# Patient Record
Sex: Female | Born: 2000
Health system: Southern US, Community
[De-identification: ages and names within clinical notes are randomized; demographics above are authoritative.]

## PROBLEM LIST (undated history)

## (undated) DIAGNOSIS — S060XAA Concussion with loss of consciousness status unknown, initial encounter: Secondary | ICD-10-CM

## (undated) DIAGNOSIS — R29898 Other symptoms and signs involving the musculoskeletal system: Secondary | ICD-10-CM

## (undated) DIAGNOSIS — S62109A Fracture of unspecified carpal bone, unspecified wrist, initial encounter for closed fracture: Secondary | ICD-10-CM

## (undated) DIAGNOSIS — L309 Dermatitis, unspecified: Secondary | ICD-10-CM

## (undated) DIAGNOSIS — T7840XA Allergy, unspecified, initial encounter: Secondary | ICD-10-CM

## (undated) DIAGNOSIS — G43909 Migraine, unspecified, not intractable, without status migrainosus: Secondary | ICD-10-CM

## (undated) DIAGNOSIS — S060X9A Concussion with loss of consciousness of unspecified duration, initial encounter: Secondary | ICD-10-CM

## (undated) HISTORY — DX: Dermatitis, unspecified: L30.9

## (undated) HISTORY — DX: Allergy, unspecified, initial encounter: T78.40XA

## (undated) HISTORY — DX: Other symptoms and signs involving the musculoskeletal system: R29.898

## (undated) HISTORY — PX: NO PAST SURGERIES: SHX2092

## (undated) HISTORY — DX: Concussion with loss of consciousness of unspecified duration, initial encounter: S06.0X9A

## (undated) HISTORY — DX: Concussion with loss of consciousness status unknown, initial encounter: S06.0XAA

## (undated) HISTORY — DX: Migraine, unspecified, not intractable, without status migrainosus: G43.909

## (undated) HISTORY — DX: Fracture of unspecified carpal bone, unspecified wrist, initial encounter for closed fracture: S62.109A

---
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
## 2021-01-08 DIAGNOSIS — R221 Localized swelling, mass and lump, neck: Secondary | ICD-10-CM | POA: Diagnosis not present

## 2021-01-08 DIAGNOSIS — T781XXA Other adverse food reactions, not elsewhere classified, initial encounter: Secondary | ICD-10-CM | POA: Diagnosis not present

## 2021-01-08 DIAGNOSIS — R110 Nausea: Secondary | ICD-10-CM | POA: Diagnosis not present

## 2021-01-08 DIAGNOSIS — R0600 Dyspnea, unspecified: Secondary | ICD-10-CM | POA: Diagnosis not present

## 2021-01-08 DIAGNOSIS — T7805XA Anaphylactic reaction due to tree nuts and seeds, initial encounter: Secondary | ICD-10-CM | POA: Diagnosis not present

## 2021-01-08 DIAGNOSIS — X58XXXA Exposure to other specified factors, initial encounter: Secondary | ICD-10-CM | POA: Diagnosis not present

## 2021-01-08 DIAGNOSIS — R220 Localized swelling, mass and lump, head: Secondary | ICD-10-CM | POA: Diagnosis not present

## 2021-01-21 ENCOUNTER — Telehealth: Payer: Self-pay | Admitting: *Deleted

## 2021-01-21 NOTE — Telephone Encounter (Signed)
Faxed completed/signed PA Aimovig to [facility]. Received fax confirmation, waiting on determination.

## 2021-01-21 NOTE — Telephone Encounter (Signed)
Received fax from [facility] that PA approved 12/22/20-01/21/22. Case ID: [id number]

## 2021-05-28 DIAGNOSIS — Z20822 Contact with and (suspected) exposure to covid-19: Secondary | ICD-10-CM | POA: Diagnosis not present

## 2021-05-28 DIAGNOSIS — U071 COVID-19: Secondary | ICD-10-CM | POA: Diagnosis not present

## 2021-06-17 ENCOUNTER — Other Ambulatory Visit: Payer: Self-pay

## 2021-06-17 ENCOUNTER — Encounter: Payer: Self-pay | Admitting: Family Medicine

## 2021-06-17 ENCOUNTER — Ambulatory Visit (INDEPENDENT_AMBULATORY_CARE_PROVIDER_SITE_OTHER): Payer: BC Managed Care – PPO | Admitting: Family Medicine

## 2021-06-17 VITALS — BP 110/62 | HR 84 | Temp 98.1°F | Ht 68.06 in | Wt 145.6 lb

## 2021-06-17 DIAGNOSIS — Z0000 Encounter for general adult medical examination without abnormal findings: Secondary | ICD-10-CM | POA: Diagnosis not present

## 2021-06-17 NOTE — Patient Instructions (Signed)
Confirm if you have had prior HPV vaccine.

## 2021-06-17 NOTE — Progress Notes (Signed)
Established Patient Office Visit  Subjective:  Patient ID: Nichole [name], female    DOB: 31-Jan-2001  Age: 20 y.o. MRN: [id number]  CC:  Chief Complaint  Patient presents with   Annual Exam    No new concerns     HPI Nichole [name] presents for annual physical exam/well visit.  She is attending [employer] and will be a rising sophomore.  First year went well.  She is planning to study public health and has a goal of being a Librarian, academic.  She states that she was seen through GYN clinic at student health last year and she thinks she had HPV vaccines last year.  We have no record at this time.  Her other immunizations are all up-to-date.  We will need tetanus booster in 2 years.  She is sexually active.  She is on birth control.  She states she has had previous Pap smear.  Also had chlamydia testing back last November which was negative.  Past medical history reviewed.  She has history of migraine headaches and is done very well with Aimovig injections.  Rarely takes Relpax.  She has been on Lexapro for anxiety and depression symptoms and those are stable.  Social history-non-smoker.  No history of illicit drug use.  Lives with biologic mother and stepfather.  Family history-mother has asthma and history of thyroid cancer.  Her father has history of ADD and bipolar disorder.  She has a brother with asthma.  Past Medical History:  Diagnosis Date   Allergy    Asthma    Broken wrist    Concussion    Eczema    Growing pains    Migraines     Past Surgical History:  Procedure Laterality Date   NO PAST SURGERIES      Family History  Problem Relation Age of Onset   Thyroid cancer Mother    Allergic rhinitis Mother    Asthma Mother        exercise induced   ADD / ADHD Father    Bipolar disorder Father    Asthma Brother     Social History   Socioeconomic History   Marital status: Single    Spouse name: Not on file   Number of children: Not on file   Years  of education: Not on file   Highest education level: Not on file  Occupational History   Not on file  Tobacco Use   Smoking status: Never   Smokeless tobacco: Never  Vaping Use   Vaping Use: Never used  Substance and Sexual Activity   Alcohol use: No    Alcohol/week: 0.0 standard drinks   Drug use: No   Sexual activity: Yes    Birth control/protection: Pill    Comment: 1st intercourse 2 yo-1 partner  Other Topics Concern   Not on file  Social History Narrative   Not on file   Social Determinants of Health   Financial Resource Strain: Not on file  Food Insecurity: Not on file  Transportation Needs: Not on file  Physical Activity: Not on file  Stress: Not on file  Social Connections: Not on file  Intimate Partner Violence: Not on file    Outpatient Medications Prior to Visit  Medication Sig Dispense Refill   eletriptan (RELPAX) 40 MG tablet Take 1 tablet (40 mg total) by mouth as needed for migraine or headache. May repeat in 2 hours if headache persists or recurs. 30 tablet 3   Erenumab-aooe (AIMOVIG) 140 MG/ML  SOAJ INJECT 140 MG UNDER THE SKIN EVERY 30 DAYS 3 mL 4   escitalopram (LEXAPRO) 10 MG tablet Take 10 mg by mouth daily.     norethindrone-ethinyl estradiol (BLISOVI FE 1/20) 1-20 MG-MCG tablet TAKE 1 TABLET DAILY FOR CONTINUOUS USE 28 tablet 0   triamcinolone (KENALOG) 0.025 % cream Apply 1 application topically 2 (two) times daily as needed. 30 g 1   zonisamide (ZONEGRAN) 100 MG capsule TAKE 2 CAPSULES DAILY 180 capsule 0   No facility-administered medications prior to visit.    Allergies  Allergen Reactions   Nuts Nausea And Vomiting    Tree nuts   Other Nausea And Vomiting    Tree nuts    ROS Review of Systems  Constitutional:  Negative for activity change, appetite change, fatigue, fever and unexpected weight change.  HENT:  Negative for ear pain, hearing loss, sore throat and trouble swallowing.   Eyes:  Negative for visual disturbance.   Respiratory:  Negative for cough and shortness of breath.   Cardiovascular:  Negative for chest pain and palpitations.  Gastrointestinal:  Negative for abdominal pain, blood in stool, constipation and diarrhea.  Endocrine: Negative for polydipsia and polyuria.  Genitourinary:  Negative for dysuria and hematuria.  Musculoskeletal:  Negative for arthralgias, back pain and myalgias.  Skin:  Negative for rash.  Neurological:  Negative for dizziness, syncope and headaches.  Hematological:  Negative for adenopathy.  Psychiatric/Behavioral:  Negative for confusion and dysphoric mood.      Objective:    Physical Exam Constitutional:      Appearance: She is well-developed.  HENT:     Head: Normocephalic and atraumatic.     Ears:     Comments: She has some cerumen in the right canal Eyes:     Pupils: Pupils are equal, round, and reactive to light.  Neck:     Thyroid: No thyromegaly.  Cardiovascular:     Rate and Rhythm: Normal rate and regular rhythm.     Heart sounds: Normal heart sounds. No murmur heard. Pulmonary:     Effort: No respiratory distress.     Breath sounds: Normal breath sounds. No wheezing or rales.  Abdominal:     General: Bowel sounds are normal. There is no distension.     Palpations: Abdomen is soft. There is no mass.     Tenderness: There is no abdominal tenderness. There is no guarding or rebound.  Musculoskeletal:        General: Normal range of motion.     Cervical back: Normal range of motion and neck supple.     Right lower leg: No edema.     Left lower leg: No edema.  Lymphadenopathy:     Cervical: No cervical adenopathy.  Skin:    Findings: No rash.  Neurological:     Mental Status: She is alert and oriented to person, place, and time.     Cranial Nerves: No cranial nerve deficit.     Deep Tendon Reflexes: Reflexes normal.  Psychiatric:        Behavior: Behavior normal.        Thought Content: Thought content normal.        Judgment: Judgment  normal.    BP 110/62 (BP Location: Left Arm, Patient Position: Sitting, Cuff Size: Normal)   Pulse 84   Temp 98.1 F (36.7 C) (Oral)   Ht 5' 8.06" (1.729 m)   Wt 145 lb 9.6 oz (66 kg)   BMI 22.10 kg/m  Wt  Readings from Last 3 Encounters:  06/17/21 145 lb 9.6 oz (66 kg) (77 %, Z= 0.74)*  07/03/20 130 lb 9.6 oz (59.2 kg) (61 %, Z= 0.28)*  11/18/19 119 lb 9.6 oz (54.3 kg) (42 %, Z= -0.19)*   * Growth percentiles are based on CDC (Girls, 2-20 Years) data.     Health Maintenance Due  Topic Date Due   HIV Screening  Never done   Hepatitis C Screening  Never done    There are no preventive care reminders to display for this patient.  Lab Results  Component Value Date   TSH 0.92 05/11/2017   Lab Results  Component Value Date   WBC 7.8 05/11/2017   HGB 13.0 05/11/2017   HCT 39.8 05/11/2017   MCV 87.5 05/11/2017   PLT 453 (H) 05/11/2017   No results found for: NA, K, CHLORIDE, CO2, GLUCOSE, BUN, CREATININE, BILITOT, ALKPHOS, AST, ALT, PROT, ALBUMIN, CALCIUM, ANIONGAP, EGFR, GFR No results found for: CHOL No results found for: HDL No results found for: LDLCALC No results found for: TRIG No results found for: CHOLHDL No results found for: HGBA1C    Assessment & Plan:   Physical exam.  Generally healthy 20 year old female.  She has had chlamydia screening reportedly through student health and immunizations are up-to-date with exception of no record of HPV vaccine but she is fairly certain she has already had this.  She will confirm.  -We discussed screening labs and she declines at this time. -Do recommend annual flu vaccine -COVID vaccines are complete  No orders of the defined types were placed in this encounter.   Follow-up: No follow-ups on file.    Carolann Littler, MD

## 2021-06-26 DIAGNOSIS — F411 Generalized anxiety disorder: Secondary | ICD-10-CM | POA: Diagnosis not present

## 2021-08-08 ENCOUNTER — Telehealth: Payer: BC Managed Care – PPO | Admitting: Physician Assistant

## 2021-08-08 DIAGNOSIS — J069 Acute upper respiratory infection, unspecified: Secondary | ICD-10-CM

## 2021-08-08 MED ORDER — BENZONATATE 100 MG PO CAPS: 100.0000 mg | ORAL_CAPSULE | Freq: Three times a day (TID) | ORAL | 0 refills | Status: DC | PRN

## 2021-08-08 MED ORDER — FLUTICASONE PROPIONATE 50 MCG/ACT NA SUSP: 2.0000 | Freq: Every day | NASAL | 0 refills | Status: DC

## 2021-08-08 NOTE — Progress Notes (Signed)
E-Visit for Upper Respiratory Infection   We are sorry you are not feeling well.  Here is how we plan to help!  Based on what you have shared with me, it looks like you may have a viral upper respiratory infection.  Upper respiratory infections are caused by a large number of viruses; however, rhinovirus is the most common cause. It is possible that it could be Covid still, even with negative testing, since you do have a positive exposure. Some people can test negative on rapid test for up to 5 days from symptom onset. Some will continuously test negative on rapid, at home test, but will test positive on the lab PCR testing. Also it is possible it could be the flu as well. Unfortunately, without testing we would not know the ultimate cause.  Symptoms vary from person to person, with common symptoms including sore throat, cough, fatigue or lack of energy and feeling of general discomfort.  A low-grade fever of up to 100.4 may present, but is often uncommon.  Symptoms vary however, and are closely related to a person's age or underlying illnesses.  The most common symptoms associated with an upper respiratory infection are nasal discharge or congestion, cough, sneezing, headache and pressure in the ears and face.  These symptoms usually persist for about 3 to 10 days, but can last up to 2 weeks.  It is important to know that upper respiratory infections do not cause serious illness or complications in most cases.    Upper respiratory infections can be transmitted from person to person, with the most common method of transmission being a person's hands.  The virus is able to live on the skin and can infect other persons for up to 2 hours after direct contact.  Also, these can be transmitted when someone coughs or sneezes; thus, it is important to cover the mouth to reduce this risk.  To keep the spread of the illness at bay, good hand hygiene is very important.  This is an infection that is most likely caused  by a virus. There are no specific treatments other than to help you with the symptoms until the infection runs its course.  We are sorry you are not feeling well.  Here is how we plan to help!   For nasal congestion, you may use an oral decongestants such as Mucinex D or if you have glaucoma or high blood pressure use plain Mucinex.  Saline nasal spray or nasal drops can help and can safely be used as often as needed for congestion.  For your congestion, I have prescribed Fluticasone nasal spray one spray in each nostril twice a day  If you do not have a history of heart disease, hypertension, diabetes or thyroid disease, prostate/bladder issues or glaucoma, you may also use Sudafed to treat nasal congestion.  It is highly recommended that you consult with a pharmacist or your primary care physician to ensure this medication is safe for you to take.     If you have a cough, you may use cough suppressants such as Delsym and Robitussin.  If you have glaucoma or high blood pressure, you can also use Coricidin HBP.   For cough I have prescribed for you A prescription cough medication called Tessalon Perles 100 mg. You may take 1-2 capsules every 8 hours as needed for cough  If you have a sore or scratchy throat, use a saltwater gargle-  to  teaspoon of salt dissolved in a 4-ounce to 8-ounce  glass of warm water.  Gargle the solution for approximately 15-30 seconds and then spit.  It is important not to swallow the solution.  You can also use throat lozenges/cough drops and Chloraseptic spray to help with throat pain or discomfort.  Warm or cold liquids can also be helpful in relieving throat pain.  For headache, pain or general discomfort, you can use Ibuprofen or Tylenol as directed.   Some authorities believe that zinc sprays or the use of Echinacea may shorten the course of your symptoms.   HOME CARE Only take medications as instructed by your medical team. Be sure to drink plenty of fluids. Water  is fine as well as fruit juices, sodas and electrolyte beverages. You may want to stay away from caffeine or alcohol. If you are nauseated, try taking small sips of liquids. How do you know if you are getting enough fluid? Your urine should be a pale yellow or almost colorless. Get rest. Taking a steamy shower or using a humidifier may help nasal congestion and ease sore throat pain. You can place a towel over your head and breathe in the steam from hot water coming from a faucet. Using a saline nasal spray works much the same way. Cough drops, hard candies and sore throat lozenges may ease your cough. Avoid close contacts especially the very young and the elderly Cover your mouth if you cough or sneeze Always remember to wash your hands.   GET HELP RIGHT AWAY IF: You develop worsening fever. If your symptoms do not improve within 10 days You develop yellow or green discharge from your nose over 3 days. You have coughing fits You develop a severe head ache or visual changes. You develop shortness of breath, difficulty breathing or start having chest pain Your symptoms persist after you have completed your treatment plan  MAKE SURE YOU  Understand these instructions. Will watch your condition. Will get help right away if you are not doing well or get worse.  Thank you for choosing an e-visit.  Your e-visit answers were reviewed by a board certified advanced clinical practitioner to complete your personal care plan. Depending upon the condition, your plan could have included both over the counter or prescription medications.  Please review your pharmacy choice. Make sure the pharmacy is open so you can pick up prescription now. If there is a problem, you may contact your provider through [employer] and have the prescription routed to another pharmacy.  Your safety is important to [place]. If you have drug allergies check your prescription carefully.   For the next 24 hours you can use  MyChart to ask questions about today's visit, request a non-urgent call back, or ask for a work or school excuse. You will get an email in the next two days asking about your experience. I hope that your e-visit has been valuable and will speed your recovery.   I provided 7 minutes of non face-to-face time during this encounter for chart review and documentation.

## 2021-09-10 ENCOUNTER — Encounter: Payer: Self-pay | Admitting: *Deleted

## 2021-09-10 ENCOUNTER — Encounter: Payer: Self-pay | Admitting: Neurology

## 2021-09-10 ENCOUNTER — Telehealth (INDEPENDENT_AMBULATORY_CARE_PROVIDER_SITE_OTHER): Payer: BC Managed Care – PPO | Admitting: Neurology

## 2021-09-10 DIAGNOSIS — T8090XA Unspecified complication following infusion and therapeutic injection, initial encounter: Secondary | ICD-10-CM | POA: Insufficient documentation

## 2021-09-10 DIAGNOSIS — G43909 Migraine, unspecified, not intractable, without status migrainosus: Secondary | ICD-10-CM

## 2021-09-10 DIAGNOSIS — G43709 Chronic migraine without aura, not intractable, without status migrainosus: Secondary | ICD-10-CM | POA: Diagnosis not present

## 2021-09-10 DIAGNOSIS — R519 Headache, unspecified: Secondary | ICD-10-CM | POA: Diagnosis not present

## 2021-09-10 DIAGNOSIS — M542 Cervicalgia: Secondary | ICD-10-CM | POA: Diagnosis not present

## 2021-09-10 MED ORDER — QULIPTA 30 MG PO TABS: 30.0000 mg | ORAL_TABLET | Freq: Every day | ORAL | 11 refills | Status: DC

## 2021-09-10 NOTE — Telephone Encounter (Signed)
Tried submitting PA on CMM. Would not allow [place] to process. CMM faxed PA form to complete. I completed and faxed back to [facility] at [phone]. Received fax confirmation. Waiting on determination.

## 2021-09-10 NOTE — Progress Notes (Signed)
[facility]  PATIENT: Nichole [name] DOB: [birth year]  REFERRING DOCTOR OR PCP:  Evelena Peat SOURCE: patient, mother, notes from Dr. Luberta Robertson  _________________________________   HISTORICAL  CHIEF COMPLAINT:  No chief complaint on file.  Virtual Visit via Video Note I connected with Cassidee Lavalais  on 09/10/21 at  8:30 AM EDT by a video enabled telemedicine application and verified that I am speaking with the correct person.  I discussed the limitations of evaluation and management by telemedicine and the availability of in person appointments. The patient expressed understanding and agreed to proceed.  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  Provider was in the office  History of Present Illness: HISTORY OF PRESENT ILLNESS:  Latish Freiermuth is an 20 yo woman with right sided chronic daily headache.  She has been on Aimovig for the past 2 years and her headaches have done much better.  She was having daily migraine headaches before starting..   She is now experiencing 3 migraines / month, often during the last week of each Aimovig cycle.   Migraines are sometimes triggered by stress.   Physical activity usually does not trigger a migraine as it had in the past.  She is on Aimovig 140 mg q4 weeks.  She tapered the zonisamide off without much change in the headache frequency.  Though she tolerates the injections systemically, she is getting small nodules at the injection site.  We spent some time discussing alternatives.  She denies neck pain currently.    She is at [employer].   She is in pre-nursing now.     HA History: Her headaches started December 2016 with pain on the right.   She was at a church camp function when pain started.   There was no unusual activity. Excedrin Migraine helped a little bit for a few hours and she was taking it only for severe HA about once every 2 weeks,    Pain became worse December 2017 and she began to seek medical attention.    When  present, pain is in the back of the head and radiates forward.   Bright lights and exertion will worsen the pain.   She is athletic and does soccer.   Nothing has helped much.   Topamax did not help.  Toradol with phenergan helped her x 1 day but made her fall asleep.   Cambia or other NSAIDs has not helped.   She tried Zomig nasal spray once without benefit.    She wakes up with mid pain that intensifies most days.     The Cephaly device was not effective. Of note she had two concussions in September 2015 and March 2016.   She did not lose consciousness though she had a HA afterwards.   There were no cognitive issues but she felt very tired with the second one and also had photophobia.    She missed a week of school with that second one  Chronic medications: Topamax had not helped.  Sinuses mild helped slightly.  Aimovig greatly reduce the frequency of headaches.  Acute medications: NSAIDs help slightly but usually for short period of time.  Zomig nasal spray helped once.  Maxalt had not helped.  Relpax has not helped  Physical Exam: She is a well-developed well-nourished woman in no acute distress.  The head is normocephalic and atraumatic.  Sclera are anicteric.  Visible skin appears normal.  The neck has a good range of motion.    She is alert and fully  oriented with fluent speech and good attention, knowledge and memory.  Extraocular muscles are intact.  Facial strength is normal.    She appears to have normal strength in the arms.  Rapid alternating movements and finger-nose-finger are performed well.   Assessment: Nursing notes Episodic migraine  Chronic daily headache  Chronic migraine w/o aura, not intractable, w/o stat migr  Neck pain  Injection site reaction, initial encounter  Plan: Because of the skin injection site reactions, she would like to switch from Aimovig.  We discussed some of the options.  To avoid more injection site reaction she would prefer a pill and we discussed  Qulipta.   .  I will start her on a dose of 30 mg.  She is not on any cytochrome P450 3A4 strong inducer or inhibitor.  Therefore, we will start with a 30 mg dose NSAID and/or Relpax for acute treatment rtc 1 year, sooner if new or worsening issues  Follow Up Instructions: I discussed the assessment and treatment plan with the patient. The patient was provided an opportunity to ask questions and all were answered. The patient agreed with the plan and demonstrated an understanding of the instructions.    The patient was advised to call back or seek an in-person evaluation if the symptoms worsen or if the condition fails to improve as anticipated.  I provided 18 minutes of non-face-to-face time during this encounter.    REVIEW OF SYSTEMS: Constitutional: No fevers, chills, sweats, or change in appetite Eyes: No visual changes, double vision, eye pain Ear, nose and throat: No hearing loss, ear pain, nasal congestion, sore throat Cardiovascular: No chest pain, palpitations Respiratory:  No shortness of breath at rest or with exertion.   No wheezes GastrointestinaI: No nausea, vomiting, diarrhea, abdominal pain, fecal incontinence Genitourinary:  No dysuria, urinary retention or frequency.  No nocturia. Musculoskeletal:  No neck pain, back pain Integumentary: No rash, pruritus, skin lesions Neurological: as above Psychiatric: No depression at this time.  No anxiety Endocrine: No palpitations, diaphoresis, change in appetite, change in weigh or increased thirst Hematologic/Lymphatic:  No anemia, purpura, petechiae. Allergic/Immunologic: No itchy/runny eyes, nasal congestion, recent allergic reactions, rashes  ALLERGIES: Allergies  Allergen Reactions   Nuts Nausea And Vomiting    Tree nuts   Other Nausea And Vomiting    Tree nuts    HOME MEDICATIONS:  Current Outpatient Medications:    Atogepant (QULIPTA) 30 MG TABS, Take 30 mg by mouth daily., Disp: 30 tablet, Rfl: 11   benzonatate  (TESSALON) 100 MG capsule, Take 1 capsule (100 mg total) by mouth 3 (three) times daily as needed., Disp: 30 capsule, Rfl: 0   eletriptan (RELPAX) 40 MG tablet, Take 1 tablet (40 mg total) by mouth as needed for migraine or headache. May repeat in 2 hours if headache persists or recurs., Disp: 30 tablet, Rfl: 3   Erenumab-aooe (AIMOVIG) 140 MG/ML SOAJ, INJECT 140 MG UNDER THE SKIN EVERY 30 DAYS, Disp: 3 mL, Rfl: 4   escitalopram (LEXAPRO) 10 MG tablet, Take 10 mg by mouth daily., Disp: , Rfl:    fluticasone (FLONASE) 50 MCG/ACT nasal spray, Place 2 sprays into both nostrils daily., Disp: 16 g, Rfl: 0   norethindrone-ethinyl estradiol (BLISOVI FE 1/20) 1-20 MG-MCG tablet, TAKE 1 TABLET DAILY FOR CONTINUOUS USE, Disp: 28 tablet, Rfl: 0   triamcinolone (KENALOG) 0.025 % cream, Apply 1 application topically 2 (two) times daily as needed., Disp: 30 g, Rfl: 1  PAST MEDICAL HISTORY: Past Medical History:  Diagnosis Date   Allergy    Asthma    Broken wrist    Concussion    Eczema    Growing pains    Migraines     PAST SURGICAL HISTORY: Past Surgical History:  Procedure Laterality Date   NO PAST SURGERIES      FAMILY HISTORY: Family History  Problem Relation Age of Onset   Thyroid cancer Mother    Allergic rhinitis Mother    Asthma Mother        exercise induced   ADD / ADHD Father    Bipolar disorder Father    Asthma Brother     SOCIAL HISTORY:  Social History   Socioeconomic History   Marital status: Single    Spouse name: Not on file   Number of children: Not on file   Years of education: Not on file   Highest education level: Not on file  Occupational History   Not on file  Tobacco Use   Smoking status: Never   Smokeless tobacco: Never  Vaping Use   Vaping Use: Never used  Substance and Sexual Activity   Alcohol use: No    Alcohol/week: 0.0 standard drinks   Drug use: No   Sexual activity: Yes    Birth control/protection: Pill    Comment: 1st intercourse 36  yo-1 partner  Other Topics Concern   Not on file  Social History Narrative   Not on file   Social Determinants of Health   Financial Resource Strain: Not on file  Food Insecurity: Not on file  Transportation Needs: Not on file  Physical Activity: Not on file  Stress: Not on file  Social Connections: Not on file  Intimate Partner Violence: Not on file     1.    Aimovig 140 mg monthly .   She may prefer Vyepti when she goes off to college as she needs help for the shots.   2.   COntinue zonisamide   3.    Relpax for breakthrough 4.   Return 12 months or sooner for new or worsening headaches or other neurologic symptoms  Laasia Arcos A. Epimenio Foot, MD, [facility] 09/10/2021, 8:44 AM Certified in Neurology, Clinical Neurophysiology, Sleep Medicine, Pain Medicine and Neuroimaging  [facility] [address], [address] [place], [place] [address] [phone]

## 2021-09-11 ENCOUNTER — Telehealth: Payer: BC Managed Care – PPO | Admitting: Neurology

## 2021-09-12 NOTE — Telephone Encounter (Signed)
PA approved for Qulipta  Effective 08/12/21-09/11/22

## 2021-09-13 DIAGNOSIS — L298 Other pruritus: Secondary | ICD-10-CM | POA: Diagnosis not present

## 2021-09-13 DIAGNOSIS — L2089 Other atopic dermatitis: Secondary | ICD-10-CM | POA: Diagnosis not present

## 2021-09-13 DIAGNOSIS — L853 Xerosis cutis: Secondary | ICD-10-CM | POA: Diagnosis not present

## 2021-09-27 DIAGNOSIS — B360 Pityriasis versicolor: Secondary | ICD-10-CM | POA: Diagnosis not present

## 2021-09-27 DIAGNOSIS — L2089 Other atopic dermatitis: Secondary | ICD-10-CM | POA: Diagnosis not present

## 2021-09-30 DIAGNOSIS — F411 Generalized anxiety disorder: Secondary | ICD-10-CM | POA: Diagnosis not present

## 2021-10-18 DIAGNOSIS — L2089 Other atopic dermatitis: Secondary | ICD-10-CM | POA: Diagnosis not present

## 2021-11-04 DIAGNOSIS — L2089 Other atopic dermatitis: Secondary | ICD-10-CM | POA: Diagnosis not present

## 2021-11-18 DIAGNOSIS — L218 Other seborrheic dermatitis: Secondary | ICD-10-CM | POA: Diagnosis not present

## 2021-11-18 DIAGNOSIS — L2089 Other atopic dermatitis: Secondary | ICD-10-CM | POA: Diagnosis not present

## 2021-11-25 ENCOUNTER — Other Ambulatory Visit: Payer: Self-pay

## 2021-11-25 MED ORDER — QULIPTA 30 MG PO TABS: 30.0000 mg | ORAL_TABLET | Freq: Every day | ORAL | 3 refills | Status: DC

## 2021-11-26 DIAGNOSIS — F411 Generalized anxiety disorder: Secondary | ICD-10-CM | POA: Diagnosis not present

## 2021-12-13 DIAGNOSIS — M9901 Segmental and somatic dysfunction of cervical region: Secondary | ICD-10-CM | POA: Diagnosis not present

## 2021-12-13 DIAGNOSIS — M9903 Segmental and somatic dysfunction of lumbar region: Secondary | ICD-10-CM | POA: Diagnosis not present

## 2021-12-13 DIAGNOSIS — M9902 Segmental and somatic dysfunction of thoracic region: Secondary | ICD-10-CM | POA: Diagnosis not present

## 2021-12-13 DIAGNOSIS — M531 Cervicobrachial syndrome: Secondary | ICD-10-CM | POA: Diagnosis not present

## 2021-12-17 DIAGNOSIS — L218 Other seborrheic dermatitis: Secondary | ICD-10-CM | POA: Diagnosis not present

## 2021-12-17 DIAGNOSIS — L2089 Other atopic dermatitis: Secondary | ICD-10-CM | POA: Diagnosis not present

## 2021-12-20 DIAGNOSIS — F411 Generalized anxiety disorder: Secondary | ICD-10-CM | POA: Diagnosis not present

## 2022-01-29 DIAGNOSIS — L2089 Other atopic dermatitis: Secondary | ICD-10-CM | POA: Diagnosis not present

## 2022-02-14 ENCOUNTER — Ambulatory Visit (INDEPENDENT_AMBULATORY_CARE_PROVIDER_SITE_OTHER): Payer: BC Managed Care – PPO | Admitting: Family Medicine

## 2022-02-14 ENCOUNTER — Other Ambulatory Visit: Payer: Self-pay

## 2022-02-14 ENCOUNTER — Encounter: Payer: Self-pay | Admitting: Family Medicine

## 2022-02-14 VITALS — BP 114/70 | HR 70 | Temp 97.8°F | Ht 68.0 in | Wt 153.7 lb

## 2022-02-14 DIAGNOSIS — R5383 Other fatigue: Secondary | ICD-10-CM | POA: Diagnosis not present

## 2022-02-14 LAB — COMPREHENSIVE METABOLIC PANEL
ALT: 13 U/L (ref 0–35)
AST: 21 U/L (ref 0–37)
Albumin: 4 g/dL (ref 3.5–5.2)
Alkaline Phosphatase: 105 U/L (ref 47–119)
BUN: 11 mg/dL (ref 6–23)
CO2: 24 mEq/L (ref 19–32)
Calcium: 9.3 mg/dL (ref 8.4–10.5)
Chloride: 106 mEq/L (ref 96–112)
Creatinine, Ser: 0.87 mg/dL (ref 0.40–1.20)
GFR: 96.21 mL/min (ref >60.00)
Glucose, Bld: 71 mg/dL (ref 70–99)
Potassium: 4.4 mEq/L (ref 3.5–5.1)
Sodium: 138 mEq/L (ref 135–145)
Total Bilirubin: 0.5 mg/dL (ref 0.2–1.2)
Total Protein: 6.8 g/dL (ref 6.0–8.3)

## 2022-02-14 LAB — CBC WITH DIFFERENTIAL/PLATELET
Basophils Absolute: 0.1 10*3/uL (ref 0.0–0.1)
Basophils Relative: 0.9 % (ref 0.0–3.0)
Eosinophils Absolute: 0.2 10*3/uL (ref 0.0–0.7)
Eosinophils Relative: 3.1 % (ref 0.0–5.0)
HCT: 40.7 % (ref 36.0–49.0)
Hemoglobin: 13.9 g/dL (ref 12.0–16.0)
Lymphocytes Relative: 39.6 % (ref 24.0–48.0)
Lymphs Abs: 2.6 10*3/uL (ref 0.7–4.0)
MCHC: 34 g/dL (ref 31.0–37.0)
MCV: 90.4 fl (ref 78.0–98.0)
Monocytes Absolute: 0.3 10*3/uL (ref 0.1–1.0)
Monocytes Relative: 5.4 % (ref 3.0–12.0)
Neutro Abs: 3.3 10*3/uL (ref 1.4–7.7)
Neutrophils Relative %: 51 % (ref 43.0–71.0)
Platelets: 428 10*3/uL (ref 150.0–575.0)
RBC: 4.51 Mil/uL (ref 3.80–5.70)
RDW: 13.1 % (ref 11.4–15.5)
WBC: 6.5 10*3/uL (ref 4.5–13.5)

## 2022-02-14 LAB — VITAMIN D 25 HYDROXY (VIT D DEFICIENCY, FRACTURES): VITD: 54.31 ng/mL (ref 30.00–100.00)

## 2022-02-14 LAB — VITAMIN B12: Vitamin B-12: 268 pg/mL (ref 211–911)

## 2022-02-14 LAB — SEDIMENTATION RATE: Sed Rate: 14 mm/hr (ref 0–20)

## 2022-02-14 LAB — C-REACTIVE PROTEIN: CRP: <1 mg/dL (ref 0.5–20.0)

## 2022-02-14 LAB — TSH: TSH: 1.63 u[IU]/mL (ref 0.40–5.00)

## 2022-02-14 NOTE — Progress Notes (Signed)
Established Patient Office Visit  Subjective:  Patient ID: Nichole [name], female    DOB: 2001-10-21  Age: 21 y.o. MRN: [id number]  CC:  Chief Complaint  Patient presents with   Fatigue    HPI Nichole [name] presents for at least several month history of increased fatigue.  She states she usually sleeps 7 to 8 hours at night usually very restful sleep but when she gets up in the morning she has relatively low energy and throughout the day.  She just got back from trip to [place] and her mother noted on that trip that she seemed to be much more fatigued than her baseline.  She does play club soccer 2 times per week and has had no difficulty with exercise though she seems to have low energy to exercise at times.  Denies any dyspnea.  No dizziness.  No headaches.  No appetite or weight changes.  She has had COVID at least a few times and has been fully vaccinated.  She has decided against any further vaccines at this point.  She is currently in school at [employer]  Does have some stress issues.  Denies depression symptoms.  She has history of severe eczema and is on Dupixent.  She has history of migraine headaches which are currently controlled.  Does occasionally have some diffuse body aches.  No fever.  No night sweats.  No adenopathy.  No significant arthralgias.  She was specifically concerned about autoimmune disorder such as lupus after doing some reading.  She has not had any recent labs in terms of basic chemistries, CBC, or TSH.  Past Medical History:  Diagnosis Date   Allergy    Asthma    Broken wrist    Concussion    Eczema    Growing pains    Migraines     Past Surgical History:  Procedure Laterality Date   NO PAST SURGERIES      Family History  Problem Relation Age of Onset   Thyroid cancer Mother    Allergic rhinitis Mother    Asthma Mother        exercise induced   ADD / ADHD Father    Bipolar disorder Father    Asthma Brother     Social History    Socioeconomic History   Marital status: Single    Spouse name: Not on file   Number of children: Not on file   Years of education: Not on file   Highest education level: GED or equivalent  Occupational History   Not on file  Tobacco Use   Smoking status: Never   Smokeless tobacco: Never  Vaping Use   Vaping Use: Never used  Substance and Sexual Activity   Alcohol use: No    Alcohol/week: 0.0 standard drinks   Drug use: No   Sexual activity: Yes    Birth control/protection: Pill    Comment: 1st intercourse 64 yo-1 partner  Other Topics Concern   Not on file  Social History Narrative   Not on file   Social Determinants of Health   Financial Resource Strain: Low Risk    Difficulty of Paying Living Expenses: Not hard at all  Food Insecurity: No Food Insecurity   Worried About Charity fundraiser in the Last Year: Never true   Ran Out of Food in the Last Year: Never true  Transportation Needs: No Transportation Needs   Lack of Transportation (Medical): No   Lack of Transportation (Non-Medical): No  Physical  Activity: Sufficiently Active   Days of Exercise per Week: 5 days   Minutes of Exercise per Session: 60 min  Stress: Stress Concern Present   Feeling of Stress : To some extent  Social Connections: Moderately Integrated   Frequency of Communication with Friends and Family: More than three times a week   Frequency of Social Gatherings with Friends and Family: More than three times a week   Attends Religious Services: More than 4 times per year   Active Member of [employer] or Organizations: Yes   Attends Archivist Meetings: 1 to 4 times per year   Marital Status: Never married  Intimate Partner Violence: Not on file    Outpatient Medications Prior to Visit  Medication Sig Dispense Refill   Atogepant (QULIPTA) 30 MG TABS Take 30 mg by mouth daily. 90 tablet 3   Dupilumab (DUPIXENT) 300 MG/2ML SOPN      escitalopram (LEXAPRO) 10 MG tablet Take 10 mg by mouth  daily.     norethindrone-ethinyl estradiol (BLISOVI FE 1/20) 1-20 MG-MCG tablet TAKE 1 TABLET DAILY FOR CONTINUOUS USE 28 tablet 0   benzonatate (TESSALON) 100 MG capsule Take 1 capsule (100 mg total) by mouth 3 (three) times daily as needed. 30 capsule 0   eletriptan (RELPAX) 40 MG tablet Take 1 tablet (40 mg total) by mouth as needed for migraine or headache. May repeat in 2 hours if headache persists or recurs. 30 tablet 3   Erenumab-aooe (AIMOVIG) 140 MG/ML SOAJ INJECT 140 MG UNDER THE SKIN EVERY 30 DAYS 3 mL 4   fluticasone (FLONASE) 50 MCG/ACT nasal spray Place 2 sprays into both nostrils daily. 16 g 0   triamcinolone (KENALOG) 0.025 % cream Apply 1 application topically 2 (two) times daily as needed. 30 g 1   No facility-administered medications prior to visit.    Allergies  Allergen Reactions   Nuts Nausea And Vomiting    Tree nuts   Other Nausea And Vomiting    Tree nuts    ROS Review of Systems  Constitutional:  Positive for fatigue. Negative for appetite change, chills, fever and unexpected weight change.  HENT:  Negative for sore throat.   Respiratory:  Negative for cough and shortness of breath.   Cardiovascular:  Negative for chest pain.  Gastrointestinal:  Negative for abdominal pain, nausea and vomiting.  Genitourinary:  Negative for dysuria.  Skin:        See HPI  Hematological:  Negative for adenopathy.     Objective:    Physical Exam Vitals reviewed.  Constitutional:      Appearance: Normal appearance.  Cardiovascular:     Rate and Rhythm: Normal rate and regular rhythm.     Heart sounds: No murmur heard. Pulmonary:     Effort: Pulmonary effort is normal.     Breath sounds: Normal breath sounds.  Abdominal:     Palpations: Abdomen is soft.     Comments: No splenomegaly or hepatomegaly.  Musculoskeletal:     Cervical back: Neck supple.     Right lower leg: No edema.     Left lower leg: No edema.  Lymphadenopathy:     Cervical: No cervical  adenopathy.  Skin:    Comments: She has some skin patches consistent with eczema with erythematous base and scaly surface  Neurological:     General: No focal deficit present.     Mental Status: She is alert.    BP 114/70 (BP Location: Left Arm, Patient Position: Sitting,  Cuff Size: Normal)    Pulse 70    Temp 97.8 F (36.6 C) (Oral)    Ht _0  (1.727 m)    Wt 153 lb 11.2 oz (69.7 kg)    SpO2 96%    BMI 23.37 kg/m  Wt Readings from Last 3 Encounters:  02/14/22 153 lb 11.2 oz (69.7 kg) (83 %, Z= 0.95)*  06/17/21 145 lb 9.6 oz (66 kg) (77 %, Z= 0.74)*  07/03/20 130 lb 9.6 oz (59.2 kg) (61 %, Z= 0.28)*   * Growth percentiles are based on CDC (Girls, 2-20 Years) data.     Health Maintenance Due  Topic Date Due   HIV Screening  Never done   Hepatitis C Screening  Never done   INFLUENZA VACCINE  07/08/2021   CHLAMYDIA SCREENING  10/12/2021    There are no preventive care reminders to display for this patient.  Lab Results  Component Value Date   TSH 0.92 05/11/2017   Lab Results  Component Value Date   WBC 7.8 05/11/2017   HGB 13.0 05/11/2017   HCT 39.8 05/11/2017   MCV 87.5 05/11/2017   PLT 453 (H) 05/11/2017   No results found for: NA, K, CHLORIDE, CO2, GLUCOSE, BUN, CREATININE, BILITOT, ALKPHOS, AST, ALT, PROT, ALBUMIN, CALCIUM, ANIONGAP, EGFR, GFR No results found for: CHOL No results found for: HDL No results found for: LDLCALC No results found for: TRIG No results found for: CHOLHDL No results found for: HGBA1C    Assessment & Plan:   Problem List Items Addressed This Visit   None Visit Diagnoses     Fatigue, unspecified type    -  Primary   Relevant Orders   CBC with Differential/Platelet   CMP   VITAMIN D 25 Hydroxy (Vit-D Deficiency, Fractures)   Vitamin B12   TSH   Sedimentation rate   C-reactive Protein   ANA   Rheumatoid Factor   Cyclic citrul peptide antibody, IgG     Adyson presents with several month history of daily increased fatigue  symptoms.  She has had some weight gain.  She specifically has concern for possible autoimmune disorder.  She appears to be getting relatively good sleep.  Nonfocal exam.  -Start with basic labs with CBC, CMP, TSH, B12 -Obtain labs to screen for inflammatory disorder such as ANA, sed rate, C-reactive protein -Continue to watch for any more specific symptoms such as adenopathy, night sweats, etc.  No orders of the defined types were placed in this encounter.   Follow-up: No follow-ups on file.    Carolann Littler, MD

## 2022-02-17 LAB — CYCLIC CITRUL PEPTIDE ANTIBODY, IGG: Cyclic Citrullin Peptide Ab: <16 UNITS

## 2022-02-17 LAB — RHEUMATOID FACTOR: Rheumatoid fact SerPl-aCnc: <14 IU/mL (ref <14)

## 2022-02-17 LAB — ANA: Anti Nuclear Antibody (ANA): NEGATIVE

## 2022-03-14 DIAGNOSIS — H1045 Other chronic allergic conjunctivitis: Secondary | ICD-10-CM | POA: Diagnosis not present

## 2022-03-19 DIAGNOSIS — L2089 Other atopic dermatitis: Secondary | ICD-10-CM | POA: Diagnosis not present

## 2022-04-14 DIAGNOSIS — F411 Generalized anxiety disorder: Secondary | ICD-10-CM | POA: Diagnosis not present

## 2022-05-13 DIAGNOSIS — F411 Generalized anxiety disorder: Secondary | ICD-10-CM | POA: Diagnosis not present

## 2022-05-26 ENCOUNTER — Telehealth: Payer: Self-pay | Admitting: Neurology

## 2022-05-26 NOTE — Telephone Encounter (Signed)
Rescheduled 10/05 appt with pt over the phone- MD out.

## 2022-05-28 DIAGNOSIS — F411 Generalized anxiety disorder: Secondary | ICD-10-CM | POA: Diagnosis not present

## 2022-07-29 DIAGNOSIS — L2089 Other atopic dermatitis: Secondary | ICD-10-CM | POA: Diagnosis not present

## 2022-08-01 DIAGNOSIS — F411 Generalized anxiety disorder: Secondary | ICD-10-CM | POA: Diagnosis not present

## 2022-08-06 DIAGNOSIS — L2089 Other atopic dermatitis: Secondary | ICD-10-CM | POA: Diagnosis not present

## 2022-08-06 DIAGNOSIS — Z79899 Other long term (current) drug therapy: Secondary | ICD-10-CM | POA: Diagnosis not present

## 2022-08-12 DIAGNOSIS — L2089 Other atopic dermatitis: Secondary | ICD-10-CM | POA: Diagnosis not present

## 2022-09-03 DIAGNOSIS — W51XXXA Accidental striking against or bumped into by another person, initial encounter: Secondary | ICD-10-CM | POA: Diagnosis not present

## 2022-09-03 DIAGNOSIS — S4992XA Unspecified injury of left shoulder and upper arm, initial encounter: Secondary | ICD-10-CM | POA: Diagnosis not present

## 2022-09-03 DIAGNOSIS — Y9366 Activity, soccer: Secondary | ICD-10-CM | POA: Diagnosis not present

## 2022-09-09 ENCOUNTER — Telehealth: Payer: BC Managed Care – PPO | Admitting: Neurology

## 2022-09-11 ENCOUNTER — Telehealth: Payer: BC Managed Care – PPO | Admitting: Neurology

## 2022-10-29 DIAGNOSIS — F411 Generalized anxiety disorder: Secondary | ICD-10-CM | POA: Diagnosis not present

## 2022-11-03 ENCOUNTER — Other Ambulatory Visit: Payer: Self-pay | Admitting: Neurology

## 2022-11-21 DIAGNOSIS — Z79899 Other long term (current) drug therapy: Secondary | ICD-10-CM | POA: Diagnosis not present

## 2022-11-21 DIAGNOSIS — L2089 Other atopic dermatitis: Secondary | ICD-10-CM | POA: Diagnosis not present

## 2022-11-23 ENCOUNTER — Other Ambulatory Visit: Payer: Self-pay | Admitting: Neurology

## 2022-11-28 DIAGNOSIS — F411 Generalized anxiety disorder: Secondary | ICD-10-CM | POA: Diagnosis not present

## 2023-01-28 DIAGNOSIS — F411 Generalized anxiety disorder: Secondary | ICD-10-CM | POA: Diagnosis not present

## 2023-03-02 ENCOUNTER — Telehealth (INDEPENDENT_AMBULATORY_CARE_PROVIDER_SITE_OTHER): Payer: BC Managed Care – PPO | Admitting: Adult Health

## 2023-03-02 DIAGNOSIS — G43709 Chronic migraine without aura, not intractable, without status migrainosus: Secondary | ICD-10-CM | POA: Diagnosis not present

## 2023-03-02 MED ORDER — QULIPTA 30 MG PO TABS: 30.0000 mg | ORAL_TABLET | Freq: Every day | ORAL | 3 refills | Status: DC

## 2023-03-02 NOTE — Progress Notes (Signed)
PATIENT: Nichole [name] DOB: 2001-06-22  REASON FOR VISIT: follow up HISTORY FROM: patient  Virtual Visit via Video Note  I connected with Nichole [name] on 03/02/23 at 11:30 AM EDT by a video enabled telemedicine application located remotely at [facility] and verified that I am speaking with the correct person using two identifiers who was located at their own home at [facility].    I discussed the limitations of evaluation and management by telemedicine and the availability of in person appointments. The patient expressed understanding and agreed to proceed.   PATIENT: Nichole [name] DOB: May 30, 2001  REASON FOR VISIT: follow up HISTORY FROM: patient  HISTORY OF PRESENT ILLNESS: Today 03/02/23:  Nichole [name] is a 22 y.o. female with a history of Migraine headaches. Returns today for follow-up. Currently on Qulipta daily. Reports only 1 headache a week. Only takes OTC medication to treat her migraines. Reports that its working well. Happy with current treatment.  HISTORY Nichole [name] is an 22 yo woman with right sided chronic daily headache.   She has been on Aimovig for the past 2 years and her headaches have done much better.  She was having daily migraine headaches before starting..   She is now experiencing 3 migraines / month, often during the last week of each Aimovig cycle.   Migraines are sometimes triggered by stress.   Physical activity usually does not trigger a migraine as it had in the past.   She is on Aimovig 140 mg q4 weeks.  She tapered the zonisamide off without much change in the headache frequency.  Though she tolerates the injections systemically, she is getting small nodules at the injection site.  We spent some time discussing alternatives.   She denies neck pain currently.    She is at [employer].   She is in pre-nursing now.      HA History: Her headaches started December 2016 with pain on the right.   She was at a church camp function  when pain started.   There was no unusual activity. Excedrin Migraine helped a little bit for a few hours and she was taking it only for severe HA about once every 2 weeks,    Pain became worse December 2017 and she began to seek medical attention.    When present, pain is in the back of the head and radiates forward.   Bright lights and exertion will worsen the pain.   She is athletic and does soccer.   Nothing has helped much.   Topamax did not help.  Toradol with phenergan helped her x 1 day but made her fall asleep.   Cambia or other NSAIDs has not helped.   She tried Zomig nasal spray once without benefit.    She wakes up with mid pain that intensifies most days.     The Cephaly device was not effective. Of note she had two concussions in September 2015 and March 2016.   She did not lose consciousness though she had a HA afterwards.   There were no cognitive issues but she felt very tired with the second one and also had photophobia.    She missed a week of school with that second one   Chronic medications: Topamax had not helped.  Sinuses mild helped slightly.  Aimovig greatly reduce the frequency of headaches.   Acute medications: NSAIDs help slightly but usually for short period of time.  Zomig nasal spray helped once.  Maxalt had not helped.  Relpax has not helped    REVIEW OF SYSTEMS: Out of a complete 14 system review of symptoms, the patient complains only of the following symptoms, and all other reviewed systems are negative.  ALLERGIES: Allergies  Allergen Reactions   Nuts Nausea And Vomiting    Tree nuts   Other Nausea And Vomiting    Tree nuts    HOME MEDICATIONS: Outpatient Medications Prior to Visit  Medication Sig Dispense Refill   Atogepant (QULIPTA) 30 MG TABS Take 30 mg by mouth daily. 90 tablet 3   Dupilumab (DUPIXENT) 300 MG/2ML SOPN      escitalopram (LEXAPRO) 10 MG tablet Take 10 mg by mouth daily.     norethindrone-ethinyl estradiol (BLISOVI FE 1/20) 1-20 MG-MCG  tablet TAKE 1 TABLET DAILY FOR CONTINUOUS USE 28 tablet 0   No facility-administered medications prior to visit.    PAST MEDICAL HISTORY: Past Medical History:  Diagnosis Date   Allergy    Asthma    Broken wrist    Concussion    Eczema    Growing pains    Migraines     PAST SURGICAL HISTORY: Past Surgical History:  Procedure Laterality Date   NO PAST SURGERIES      FAMILY HISTORY: Family History  Problem Relation Age of Onset   Thyroid cancer Mother    Allergic rhinitis Mother    Asthma Mother        exercise induced   ADD / ADHD Father    Bipolar disorder Father    Asthma Brother     SOCIAL HISTORY: Social History   Socioeconomic History   Marital status: Single    Spouse name: Not on file   Number of children: Not on file   Years of education: Not on file   Highest education level: GED or equivalent  Occupational History   Not on file  Tobacco Use   Smoking status: Never   Smokeless tobacco: Never  Vaping Use   Vaping Use: Never used  Substance and Sexual Activity   Alcohol use: No    Alcohol/week: 0.0 standard drinks of alcohol   Drug use: No   Sexual activity: Yes    Birth control/protection: Pill    Comment: 1st intercourse 80 yo-1 partner  Other Topics Concern   Not on file  Social History Narrative   Not on file   Social Determinants of Health   Financial Resource Strain: Low Risk  (02/13/2022)   Overall Financial Resource Strain (CARDIA)    Difficulty of Paying Living Expenses: Not hard at all  Food Insecurity: No Food Insecurity (02/13/2022)   Hunger Vital Sign    Worried About Running Out of Food in the Last Year: Never true    Ran Out of Food in the Last Year: Never true  Transportation Needs: No Transportation Needs (02/13/2022)   PRAPARE - Hydrologist (Medical): No    Lack of Transportation (Non-Medical): No  Physical Activity: Sufficiently Active (02/13/2022)   Exercise Vital Sign    Days of Exercise per  Week: 5 days    Minutes of Exercise per Session: 60 min  Stress: Stress Concern Present (02/13/2022)   [place]    Feeling of Stress : To some extent  Social Connections: Moderately Integrated (02/13/2022)   Social Connection and Isolation Panel [NHANES]    Frequency of Communication with Friends and Family: More than three times a week    Frequency  of Social Gatherings with Friends and Family: More than three times a week    Attends Religious Services: More than 4 times per year    Active Member of [employer] or Organizations: Yes    Attends Archivist Meetings: 1 to 4 times per year    Marital Status: Never married  Intimate Partner Violence: Not on file      PHYSICAL EXAM Generalized: Well developed, in no acute distress   Neurological examination  Mentation: Alert oriented to time, place, history taking. Follows all commands speech and language fluent Cranial nerve II-XII:Facial symmetry noted.  Reflexes: UTA  DIAGNOSTIC DATA (LABS, IMAGING, TESTING) - I reviewed patient records, labs, notes, testing and imaging myself where available.  Lab Results  Component Value Date   WBC 6.5 02/14/2022   HGB 13.9 02/14/2022   HCT 40.7 02/14/2022   MCV 90.4 02/14/2022   PLT 428.0 02/14/2022      Component Value Date/Time   NA 138 02/14/2022 1004   K 4.4 02/14/2022 1004   CL 106 02/14/2022 1004   CO2 24 02/14/2022 1004   GLUCOSE 71 02/14/2022 1004   BUN 11 02/14/2022 1004   CREATININE 0.87 02/14/2022 1004   CALCIUM 9.3 02/14/2022 1004   PROT 6.8 02/14/2022 1004   ALBUMIN 4.0 02/14/2022 1004   AST 21 02/14/2022 1004   ALT 13 02/14/2022 1004   ALKPHOS 105 02/14/2022 1004   BILITOT 0.5 02/14/2022 1004   Lab Results  Component Value Date   VITAMINB12 268 02/14/2022   Lab Results  Component Value Date   TSH 1.63 02/14/2022      ASSESSMENT AND PLAN 22 y.o. year old female  has a past medical history  of Allergy, Asthma, Broken wrist, Concussion, Eczema, Growing pains, and Migraines. here with:  Migraines  - Continue Qulipta 30 mg daily  - Advised if headache frequency or severity increases let us know - FU in 1 year or sooner if needed   Ward Givens, MSN, NP-C 03/02/2023, 11:34 AM [facility] [address], [facility] [place], [place] [address] [phone]

## 2023-03-05 ENCOUNTER — Telehealth: Payer: Self-pay | Admitting: *Deleted

## 2023-03-05 NOTE — Telephone Encounter (Signed)
Completed renewal PA for Qulipta on CMM. KeyTU:[id number]. Approved immediately through 03/04/24.

## 2023-03-14 DIAGNOSIS — J0140 Acute pansinusitis, unspecified: Secondary | ICD-10-CM | POA: Diagnosis not present

## 2023-03-14 DIAGNOSIS — R5383 Other fatigue: Secondary | ICD-10-CM | POA: Diagnosis not present

## 2023-05-22 DIAGNOSIS — Z79899 Other long term (current) drug therapy: Secondary | ICD-10-CM | POA: Diagnosis not present

## 2023-05-22 DIAGNOSIS — L2089 Other atopic dermatitis: Secondary | ICD-10-CM | POA: Diagnosis not present

## 2023-07-21 DIAGNOSIS — F419 Anxiety disorder, unspecified: Secondary | ICD-10-CM | POA: Diagnosis not present

## 2023-07-21 DIAGNOSIS — F338 Other recurrent depressive disorders: Secondary | ICD-10-CM | POA: Diagnosis not present

## 2023-08-21 DIAGNOSIS — F419 Anxiety disorder, unspecified: Secondary | ICD-10-CM | POA: Diagnosis not present

## 2023-08-21 DIAGNOSIS — F338 Other recurrent depressive disorders: Secondary | ICD-10-CM | POA: Diagnosis not present

## 2023-11-13 DIAGNOSIS — L2089 Other atopic dermatitis: Secondary | ICD-10-CM | POA: Diagnosis not present

## 2023-11-25 DIAGNOSIS — F419 Anxiety disorder, unspecified: Secondary | ICD-10-CM | POA: Diagnosis not present

## 2023-11-25 DIAGNOSIS — F338 Other recurrent depressive disorders: Secondary | ICD-10-CM | POA: Diagnosis not present

## 2023-12-04 DIAGNOSIS — F419 Anxiety disorder, unspecified: Secondary | ICD-10-CM | POA: Diagnosis not present

## 2023-12-04 DIAGNOSIS — F338 Other recurrent depressive disorders: Secondary | ICD-10-CM | POA: Diagnosis not present

## 2023-12-14 DIAGNOSIS — F902 Attention-deficit hyperactivity disorder, combined type: Secondary | ICD-10-CM | POA: Diagnosis not present

## 2023-12-14 DIAGNOSIS — F419 Anxiety disorder, unspecified: Secondary | ICD-10-CM | POA: Diagnosis not present

## 2023-12-14 DIAGNOSIS — F338 Other recurrent depressive disorders: Secondary | ICD-10-CM | POA: Diagnosis not present

## 2024-01-11 DIAGNOSIS — F419 Anxiety disorder, unspecified: Secondary | ICD-10-CM | POA: Diagnosis not present

## 2024-01-11 DIAGNOSIS — F902 Attention-deficit hyperactivity disorder, combined type: Secondary | ICD-10-CM | POA: Diagnosis not present

## 2024-01-11 DIAGNOSIS — F338 Other recurrent depressive disorders: Secondary | ICD-10-CM | POA: Diagnosis not present

## 2024-01-14 ENCOUNTER — Telehealth: Payer: Self-pay | Admitting: Family Medicine

## 2024-01-31 DIAGNOSIS — M25562 Pain in left knee: Secondary | ICD-10-CM | POA: Diagnosis not present

## 2024-02-04 ENCOUNTER — Telehealth: Payer: Self-pay | Admitting: Pharmacist

## 2024-02-04 NOTE — Telephone Encounter (Signed)
 Pharmacy Patient Advocate Encounter   Received notification from CoverMyMeds that prior authorization for Qulipta 30MG  tablets is required/requested.   Insurance verification completed.   The patient is insured through [employer] .   Per test claim: PA required; PA submitted to above mentioned insurance via CoverMyMeds Key/confirmation #/EOC [id number] Status is pending

## 2024-02-04 NOTE — Telephone Encounter (Signed)
 Pharmacy Patient Advocate Encounter  Received notification from [employer] that Prior Authorization for Qulipta 30MG  tablets has been APPROVED from 01/05/2024 to 02/03/2025   PA #/Case ID/Reference #: [id number]

## 2024-02-08 DIAGNOSIS — F338 Other recurrent depressive disorders: Secondary | ICD-10-CM | POA: Diagnosis not present

## 2024-02-08 DIAGNOSIS — F902 Attention-deficit hyperactivity disorder, combined type: Secondary | ICD-10-CM | POA: Diagnosis not present

## 2024-02-08 DIAGNOSIS — M25562 Pain in left knee: Secondary | ICD-10-CM | POA: Diagnosis not present

## 2024-02-08 DIAGNOSIS — F419 Anxiety disorder, unspecified: Secondary | ICD-10-CM | POA: Diagnosis not present

## 2024-03-14 ENCOUNTER — Other Ambulatory Visit: Payer: Self-pay | Admitting: Adult Health

## 2024-03-17 ENCOUNTER — Telehealth: Payer: Self-pay | Admitting: Adult Health

## 2024-03-17 MED ORDER — QULIPTA 30 MG PO TABS: 30.0000 mg | ORAL_TABLET | Freq: Every day | ORAL | 0 refills | Status: DC

## 2024-03-17 NOTE — Addendum Note (Signed)
 Addended by: Bertram Savin on: 03/17/2024 03:54 PM   Modules accepted: Orders

## 2024-03-17 NOTE — Telephone Encounter (Signed)
 Ok thank you, will send in refill for Qulipta. Pt is scheduled for OV and I am placing her on the wait list.

## 2024-03-17 NOTE — Telephone Encounter (Signed)
 Pt last seen 02/2023. We do not prescribe Lexapro for her. I called the patient. She apologized and said she meant to say [place]. She would like to do a VV if possible. I told her I would need to review with Aundra Millet NP since he hasn't been seen in the office since 2020. I told her with appt scheduled we can send in a refill but cannot do PA if it's been over a year since we've seen her. Pt understood and thanked me for the call.

## 2024-03-17 NOTE — Telephone Encounter (Signed)
 Pt request refill for escitalopram (LEXAPRO) 10 MG tablet [id number]  Pt schedule appt on 08/18/24 at 2:00 pm

## 2024-03-17 NOTE — Telephone Encounter (Signed)
 Spoke with pt. She is ok with plan. She can call us anytime to see if there's a cancellation as well.

## 2024-03-17 NOTE — Telephone Encounter (Signed)
 I advise any in the phone room that she needs an in office visit

## 2024-03-17 NOTE — Telephone Encounter (Signed)
 Pt request refill for Atogepant (QULIPTA) 30 MG TABS [id number].  Patient scheduled appt at 08/18/24 at 2:00 pm

## 2024-03-17 NOTE — Telephone Encounter (Signed)
 Noted. I spoke with patient earlier about this and documented in phone note.

## 2024-03-22 DIAGNOSIS — G43109 Migraine with aura, not intractable, without status migrainosus: Secondary | ICD-10-CM | POA: Diagnosis not present

## 2024-03-22 DIAGNOSIS — M9901 Segmental and somatic dysfunction of cervical region: Secondary | ICD-10-CM | POA: Diagnosis not present

## 2024-03-22 DIAGNOSIS — M7912 Myalgia of auxiliary muscles, head and neck: Secondary | ICD-10-CM | POA: Diagnosis not present

## 2024-03-24 DIAGNOSIS — S83272A Complex tear of lateral meniscus, current injury, left knee, initial encounter: Secondary | ICD-10-CM | POA: Diagnosis not present

## 2024-03-30 DIAGNOSIS — M9901 Segmental and somatic dysfunction of cervical region: Secondary | ICD-10-CM | POA: Diagnosis not present

## 2024-03-30 DIAGNOSIS — M7912 Myalgia of auxiliary muscles, head and neck: Secondary | ICD-10-CM | POA: Diagnosis not present

## 2024-04-01 DIAGNOSIS — S83272A Complex tear of lateral meniscus, current injury, left knee, initial encounter: Secondary | ICD-10-CM | POA: Diagnosis not present

## 2024-04-05 DIAGNOSIS — M9901 Segmental and somatic dysfunction of cervical region: Secondary | ICD-10-CM | POA: Diagnosis not present

## 2024-04-05 DIAGNOSIS — M7912 Myalgia of auxiliary muscles, head and neck: Secondary | ICD-10-CM | POA: Diagnosis not present

## 2024-04-06 DIAGNOSIS — S83272A Complex tear of lateral meniscus, current injury, left knee, initial encounter: Secondary | ICD-10-CM | POA: Diagnosis not present

## 2024-04-12 DIAGNOSIS — S83272A Complex tear of lateral meniscus, current injury, left knee, initial encounter: Secondary | ICD-10-CM | POA: Diagnosis not present

## 2024-04-14 DIAGNOSIS — M9901 Segmental and somatic dysfunction of cervical region: Secondary | ICD-10-CM | POA: Diagnosis not present

## 2024-04-19 DIAGNOSIS — S83272A Complex tear of lateral meniscus, current injury, left knee, initial encounter: Secondary | ICD-10-CM | POA: Diagnosis not present

## 2024-04-21 DIAGNOSIS — M9901 Segmental and somatic dysfunction of cervical region: Secondary | ICD-10-CM | POA: Diagnosis not present

## 2024-04-25 DIAGNOSIS — S83272A Complex tear of lateral meniscus, current injury, left knee, initial encounter: Secondary | ICD-10-CM | POA: Diagnosis not present

## 2024-04-26 ENCOUNTER — Telehealth: Payer: Self-pay | Admitting: *Deleted

## 2024-04-26 NOTE — Telephone Encounter (Signed)
 [id number] Nichole [name] on [address]. I called and LMVM for pt to return call or email about change.  Last script 03-17-2024 tp [facility] [place] (90 days).   [phone],, [phone]

## 2024-04-28 ENCOUNTER — Other Ambulatory Visit: Payer: Self-pay | Admitting: *Deleted

## 2024-04-28 MED ORDER — QULIPTA 30 MG PO TABS: 30.0000 mg | ORAL_TABLET | Freq: Every day | ORAL | 0 refills | Status: DC

## 2024-05-11 DIAGNOSIS — S83272A Complex tear of lateral meniscus, current injury, left knee, initial encounter: Secondary | ICD-10-CM | POA: Diagnosis not present

## 2024-05-11 DIAGNOSIS — L2089 Other atopic dermatitis: Secondary | ICD-10-CM | POA: Diagnosis not present

## 2024-05-16 DIAGNOSIS — S83272A Complex tear of lateral meniscus, current injury, left knee, initial encounter: Secondary | ICD-10-CM | POA: Diagnosis not present

## 2024-05-31 DIAGNOSIS — S83272A Complex tear of lateral meniscus, current injury, left knee, initial encounter: Secondary | ICD-10-CM | POA: Diagnosis not present

## 2024-06-15 DIAGNOSIS — F902 Attention-deficit hyperactivity disorder, combined type: Secondary | ICD-10-CM | POA: Diagnosis not present

## 2024-06-15 DIAGNOSIS — S83272A Complex tear of lateral meniscus, current injury, left knee, initial encounter: Secondary | ICD-10-CM | POA: Diagnosis not present

## 2024-06-15 DIAGNOSIS — F338 Other recurrent depressive disorders: Secondary | ICD-10-CM | POA: Diagnosis not present

## 2024-06-15 DIAGNOSIS — F419 Anxiety disorder, unspecified: Secondary | ICD-10-CM | POA: Diagnosis not present

## 2024-06-23 DIAGNOSIS — Z79899 Other long term (current) drug therapy: Secondary | ICD-10-CM | POA: Diagnosis not present

## 2024-06-23 DIAGNOSIS — S83272A Complex tear of lateral meniscus, current injury, left knee, initial encounter: Secondary | ICD-10-CM | POA: Diagnosis not present

## 2024-06-23 DIAGNOSIS — L2089 Other atopic dermatitis: Secondary | ICD-10-CM | POA: Diagnosis not present

## 2024-06-29 DIAGNOSIS — S83272A Complex tear of lateral meniscus, current injury, left knee, initial encounter: Secondary | ICD-10-CM | POA: Diagnosis not present

## 2024-07-06 DIAGNOSIS — Z87820 Personal history of traumatic brain injury: Secondary | ICD-10-CM | POA: Diagnosis not present

## 2024-07-06 DIAGNOSIS — S0990XA Unspecified injury of head, initial encounter: Secondary | ICD-10-CM | POA: Diagnosis not present

## 2024-07-06 DIAGNOSIS — W541XXA Struck by dog, initial encounter: Secondary | ICD-10-CM | POA: Diagnosis not present

## 2024-07-06 DIAGNOSIS — R55 Syncope and collapse: Secondary | ICD-10-CM | POA: Diagnosis not present

## 2024-07-06 DIAGNOSIS — R5383 Other fatigue: Secondary | ICD-10-CM | POA: Diagnosis not present

## 2024-07-06 DIAGNOSIS — R42 Dizziness and giddiness: Secondary | ICD-10-CM | POA: Diagnosis not present

## 2024-07-06 DIAGNOSIS — R519 Headache, unspecified: Secondary | ICD-10-CM | POA: Diagnosis not present

## 2024-07-11 DIAGNOSIS — R42 Dizziness and giddiness: Secondary | ICD-10-CM | POA: Diagnosis not present

## 2024-07-11 DIAGNOSIS — G43909 Migraine, unspecified, not intractable, without status migrainosus: Secondary | ICD-10-CM | POA: Diagnosis not present

## 2024-07-11 DIAGNOSIS — Z6820 Body mass index (BMI) 20.0-20.9, adult: Secondary | ICD-10-CM | POA: Diagnosis not present

## 2024-07-15 DIAGNOSIS — R7989 Other specified abnormal findings of blood chemistry: Secondary | ICD-10-CM | POA: Diagnosis not present

## 2024-07-25 DIAGNOSIS — R000 Tachycardia, unspecified: Secondary | ICD-10-CM | POA: Diagnosis not present

## 2024-07-25 DIAGNOSIS — F909 Attention-deficit hyperactivity disorder, unspecified type: Secondary | ICD-10-CM | POA: Diagnosis not present

## 2024-07-25 DIAGNOSIS — Z23 Encounter for immunization: Secondary | ICD-10-CM | POA: Diagnosis not present

## 2024-07-25 DIAGNOSIS — R42 Dizziness and giddiness: Secondary | ICD-10-CM | POA: Diagnosis not present

## 2024-07-25 DIAGNOSIS — N898 Other specified noninflammatory disorders of vagina: Secondary | ICD-10-CM | POA: Diagnosis not present

## 2024-07-27 DIAGNOSIS — R000 Tachycardia, unspecified: Secondary | ICD-10-CM | POA: Diagnosis not present

## 2024-08-01 DIAGNOSIS — Z3009 Encounter for other general counseling and advice on contraception: Secondary | ICD-10-CM | POA: Diagnosis not present

## 2024-08-01 DIAGNOSIS — N7689 Other specified inflammation of vagina and vulva: Secondary | ICD-10-CM | POA: Diagnosis not present

## 2024-08-01 DIAGNOSIS — E88819 Insulin resistance, unspecified: Secondary | ICD-10-CM | POA: Diagnosis not present

## 2024-08-01 DIAGNOSIS — F411 Generalized anxiety disorder: Secondary | ICD-10-CM | POA: Diagnosis not present

## 2024-08-01 DIAGNOSIS — Z6820 Body mass index (BMI) 20.0-20.9, adult: Secondary | ICD-10-CM | POA: Diagnosis not present

## 2024-08-02 DIAGNOSIS — N9089 Other specified noninflammatory disorders of vulva and perineum: Secondary | ICD-10-CM | POA: Diagnosis not present

## 2024-08-04 DIAGNOSIS — R0602 Shortness of breath: Secondary | ICD-10-CM | POA: Diagnosis not present

## 2024-08-04 DIAGNOSIS — R002 Palpitations: Secondary | ICD-10-CM | POA: Diagnosis not present

## 2024-08-04 DIAGNOSIS — Z6820 Body mass index (BMI) 20.0-20.9, adult: Secondary | ICD-10-CM | POA: Diagnosis not present

## 2024-08-11 DIAGNOSIS — F411 Generalized anxiety disorder: Secondary | ICD-10-CM | POA: Diagnosis not present

## 2024-08-11 DIAGNOSIS — S83272A Complex tear of lateral meniscus, current injury, left knee, initial encounter: Secondary | ICD-10-CM | POA: Diagnosis not present

## 2024-08-18 ENCOUNTER — Ambulatory Visit: Admitting: Adult Health

## 2024-08-24 DIAGNOSIS — F411 Generalized anxiety disorder: Secondary | ICD-10-CM | POA: Diagnosis not present

## 2024-09-01 DIAGNOSIS — N9089 Other specified noninflammatory disorders of vulva and perineum: Secondary | ICD-10-CM | POA: Diagnosis not present

## 2024-09-01 DIAGNOSIS — Z6821 Body mass index (BMI) 21.0-21.9, adult: Secondary | ICD-10-CM | POA: Diagnosis not present

## 2024-09-08 ENCOUNTER — Other Ambulatory Visit: Payer: Self-pay | Admitting: Adult Health

## 2024-09-08 DIAGNOSIS — E88819 Insulin resistance, unspecified: Secondary | ICD-10-CM | POA: Diagnosis not present

## 2024-09-09 DIAGNOSIS — E282 Polycystic ovarian syndrome: Secondary | ICD-10-CM | POA: Diagnosis not present

## 2024-09-09 DIAGNOSIS — Z6821 Body mass index (BMI) 21.0-21.9, adult: Secondary | ICD-10-CM | POA: Diagnosis not present

## 2024-09-25 NOTE — Progress Notes (Unsigned)
 PATIENT: Nichole [name] DOB: 08-Jul-2001  REASON FOR VISIT: follow up HISTORY FROM: patient  HISTORY OF PRESENT ILLNESS: Today 09/25/24:  Nichole [name] is a 23 y.o. female with a history of Migraine headaches. Returns today for follow-up. Currently on Qulipta  daily. Reports only 1 headache a week. Only takes OTC medication to treat her migraines. Reports that its working well. Happy with current treatment.  HISTORY Winston Traub is an 23 yo woman with right sided chronic daily headache.   She has been on Aimovig  for the past 2 years and her headaches have done much better.  She was having daily migraine headaches before starting..   She is now experiencing 3 migraines / month, often during the last week of each Aimovig  cycle.   Migraines are sometimes triggered by stress.   Physical activity usually does not trigger a migraine as it had in the past.   She is on Aimovig  140 mg q4 weeks.  She tapered the zonisamide  off without much change in the headache frequency.  Though she tolerates the injections systemically, she is getting small nodules at the injection site.  We spent some time discussing alternatives.   She denies neck pain currently.    She is at [employer].   She is in pre-nursing now.      HA History: Her headaches started December 2016 with pain on the right.   She was at a church camp function when pain started.   There was no unusual activity. Excedrin Migraine helped a little bit for a few hours and she was taking it only for severe HA about once every 2 weeks,    Pain became worse December 2017 and she began to seek medical attention.    When present, pain is in the back of the head and radiates forward.   Bright lights and exertion will worsen the pain.   She is athletic and does soccer.   Nothing has helped much.   Topamax did not help.  Toradol  with phenergan  helped her x 1 day but made her fall asleep.   Cambia or other NSAIDs has not helped.   She tried Zomig nasal  spray once without benefit.    She wakes up with mid pain that intensifies most days.     The Cephaly device was not effective. Of note she had two concussions in September 2015 and March 2016.   She did not lose consciousness though she had a HA afterwards.   There were no cognitive issues but she felt very tired with the second one and also had photophobia.    She missed a week of school with that second one   Chronic medications: Topamax had not helped.  Sinuses mild helped slightly.  Aimovig  greatly reduce the frequency of headaches.   Acute medications: NSAIDs help slightly but usually for short period of time.  Zomig nasal spray helped once.  Maxalt  had not helped.  Relpax  has not helped    REVIEW OF SYSTEMS: Out of a complete 14 system review of symptoms, the patient complains only of the following symptoms, and all other reviewed systems are negative.  ALLERGIES: Allergies  Allergen Reactions   Nuts Nausea And Vomiting    Tree nuts   Other Nausea And Vomiting    Tree nuts    HOME MEDICATIONS: Outpatient Medications Prior to Visit  Medication Sig Dispense Refill   Atogepant  (QULIPTA ) 30 MG TABS TAKE 1 TABLET BY MOUTH EVERY DAY 30 tablet  0   Dupilumab (DUPIXENT) 300 MG/2ML SOPN      escitalopram (LEXAPRO) 10 MG tablet Take 10 mg by mouth daily.     norethindrone-ethinyl estradiol (BLISOVI FE 1/20) 1-20 MG-MCG tablet TAKE 1 TABLET DAILY FOR CONTINUOUS USE 28 tablet 0   No facility-administered medications prior to visit.    PAST MEDICAL HISTORY: Past Medical History:  Diagnosis Date   Allergy    Asthma    Broken wrist    Concussion    Eczema    Growing pains    Migraines     PAST SURGICAL HISTORY: Past Surgical History:  Procedure Laterality Date   NO PAST SURGERIES      FAMILY HISTORY: Family History  Problem Relation Age of Onset   Thyroid  cancer Mother    Allergic rhinitis Mother    Asthma Mother        exercise induced   ADD / ADHD Father    Bipolar  disorder Father    Asthma Brother     SOCIAL HISTORY: Social History   Socioeconomic History   Marital status: Single    Spouse name: Not on file   Number of children: Not on file   Years of education: Not on file   Highest education level: GED or equivalent  Occupational History   Not on file  Tobacco Use   Smoking status: Never   Smokeless tobacco: Never  Vaping Use   Vaping status: Never Used  Substance and Sexual Activity   Alcohol use: No    Alcohol/week: 0.0 standard drinks of alcohol   Drug use: No   Sexual activity: Yes    Birth control/protection: Pill    Comment: 1st intercourse 19 yo-1 partner  Other Topics Concern   Not on file  Social History Narrative   Not on file   Social Drivers of Health   Financial Resource Strain: Low Risk  (02/13/2022)   Overall Financial Resource Strain (CARDIA)    Difficulty of Paying Living Expenses: Not hard at all  Food Insecurity: No Food Insecurity (02/13/2022)   Hunger Vital Sign    Worried About Running Out of Food in the Last Year: Never true    Ran Out of Food in the Last Year: Never true  Transportation Needs: No Transportation Needs (02/13/2022)   PRAPARE - Administrator, Civil Service (Medical): No    Lack of Transportation (Non-Medical): No  Physical Activity: Sufficiently Active (02/13/2022)   Exercise Vital Sign    Days of Exercise per Week: 5 days    Minutes of Exercise per Session: 60 min  Stress: Stress Concern Present (02/13/2022)   [employer] of Occupational Health - Occupational Stress Questionnaire    Feeling of Stress : To some extent  Social Connections: Unknown (04/10/2023)   Received from [facility]   Social Network    Social Network: Not on file  Intimate Partner Violence: Not At Risk (07/06/2024)   Received from [facility]   HITS    Over the last 12 months how often did your partner physically hurt you?: Never    Over the last 12 months how often did your partner insult you or  talk down to you?: Never    Over the last 12 months how often did your partner threaten you with physical harm?: Never    Over the last 12 months how often did your partner scream or curse at you?: Never      PHYSICAL EXAM Generalized: Well developed,  in no acute distress   Neurological examination  Mentation: Alert oriented to time, place, history taking. Follows all commands speech and language fluent Cranial nerve II-XII:Facial symmetry noted.  Reflexes: UTA  DIAGNOSTIC DATA (LABS, IMAGING, TESTING) - I reviewed patient records, labs, notes, testing and imaging myself where available.  Lab Results  Component Value Date   WBC 6.5 02/14/2022   HGB 13.9 02/14/2022   HCT 40.7 02/14/2022   MCV 90.4 02/14/2022   PLT 428.0 02/14/2022      Component Value Date/Time   NA 138 02/14/2022 1004   K 4.4 02/14/2022 1004   CL 106 02/14/2022 1004   CO2 24 02/14/2022 1004   GLUCOSE 71 02/14/2022 1004   BUN 11 02/14/2022 1004   CREATININE 0.87 02/14/2022 1004   CALCIUM 9.3 02/14/2022 1004   PROT 6.8 02/14/2022 1004   ALBUMIN 4.0 02/14/2022 1004   AST 21 02/14/2022 1004   ALT 13 02/14/2022 1004   ALKPHOS 105 02/14/2022 1004   BILITOT 0.5 02/14/2022 1004   Lab Results  Component Value Date   VITAMINB12 268 02/14/2022   Lab Results  Component Value Date   TSH 1.63 02/14/2022      ASSESSMENT AND PLAN 23 y.o. year old female  has a past medical history of Allergy, Asthma, Broken wrist, Concussion, Eczema, Growing pains, and Migraines. here with:  Migraines  - Continue Qulipta  30 mg daily  - Advised if headache frequency or severity increases let us  know - FU in 1 year or sooner if needed   Duwaine Russell, MSN, NP-C 09/25/2024, 2:48 PM [facility] [address], [address] [place], [place] [address] [phone]

## 2024-09-26 ENCOUNTER — Encounter: Payer: Self-pay | Admitting: Adult Health

## 2024-09-26 ENCOUNTER — Ambulatory Visit: Admitting: Adult Health

## 2024-09-26 VITALS — BP 99/60 | HR 66 | Ht 68.0 in | Wt 142.8 lb

## 2024-09-26 DIAGNOSIS — G43E09 Chronic migraine with aura, not intractable, without status migrainosus: Secondary | ICD-10-CM

## 2024-09-26 DIAGNOSIS — R21 Rash and other nonspecific skin eruption: Secondary | ICD-10-CM | POA: Diagnosis not present

## 2024-09-26 DIAGNOSIS — R55 Syncope and collapse: Secondary | ICD-10-CM | POA: Diagnosis not present

## 2024-09-26 MED ORDER — QULIPTA 60 MG PO TABS: 60.0000 mg | ORAL_TABLET | Freq: Every day | ORAL | 11 refills | Status: AC

## 2024-09-27 LAB — ANA W/REFLEX: Anti Nuclear Antibody (ANA): NEGATIVE

## 2024-09-28 ENCOUNTER — Telehealth: Payer: Self-pay | Admitting: *Deleted

## 2024-09-28 ENCOUNTER — Telehealth: Payer: Self-pay | Admitting: Adult Health

## 2024-09-28 NOTE — Telephone Encounter (Signed)
 [employer] shara: [id number] exp. 09/28/24-12/26/24 sent to:  [facility] [address]  [place] , [place] [address] Phone: [phone] Phone: [phone] Fax: [phone]

## 2024-09-29 ENCOUNTER — Ambulatory Visit: Payer: Self-pay | Admitting: Adult Health

## 2024-09-29 NOTE — Telephone Encounter (Signed)
 Relayed result of lab to pt via mychart.

## 2024-10-03 DIAGNOSIS — F419 Anxiety disorder, unspecified: Secondary | ICD-10-CM | POA: Diagnosis not present

## 2024-10-03 DIAGNOSIS — S83272A Complex tear of lateral meniscus, current injury, left knee, initial encounter: Secondary | ICD-10-CM | POA: Diagnosis not present

## 2024-10-03 DIAGNOSIS — Z111 Encounter for screening for respiratory tuberculosis: Secondary | ICD-10-CM | POA: Diagnosis not present

## 2024-10-03 DIAGNOSIS — F338 Other recurrent depressive disorders: Secondary | ICD-10-CM | POA: Diagnosis not present

## 2024-10-03 DIAGNOSIS — F902 Attention-deficit hyperactivity disorder, combined type: Secondary | ICD-10-CM | POA: Diagnosis not present

## 2024-10-12 DIAGNOSIS — S83272A Complex tear of lateral meniscus, current injury, left knee, initial encounter: Secondary | ICD-10-CM | POA: Diagnosis not present

## 2024-10-12 DIAGNOSIS — L309 Dermatitis, unspecified: Secondary | ICD-10-CM | POA: Diagnosis not present

## 2024-10-12 DIAGNOSIS — Z79899 Other long term (current) drug therapy: Secondary | ICD-10-CM | POA: Diagnosis not present

## 2024-10-20 DIAGNOSIS — Z13228 Encounter for screening for other metabolic disorders: Secondary | ICD-10-CM | POA: Diagnosis not present

## 2024-10-20 DIAGNOSIS — Z131 Encounter for screening for diabetes mellitus: Secondary | ICD-10-CM | POA: Diagnosis not present

## 2024-10-20 DIAGNOSIS — Z0000 Encounter for general adult medical examination without abnormal findings: Secondary | ICD-10-CM | POA: Diagnosis not present

## 2024-10-20 DIAGNOSIS — Z130 Encounter for screening for diseases of the blood and blood-forming organs and certain disorders involving the immune mechanism: Secondary | ICD-10-CM | POA: Diagnosis not present

## 2024-10-20 DIAGNOSIS — F411 Generalized anxiety disorder: Secondary | ICD-10-CM | POA: Diagnosis not present

## 2024-10-20 DIAGNOSIS — Z136 Encounter for screening for cardiovascular disorders: Secondary | ICD-10-CM | POA: Diagnosis not present

## 2024-10-27 DIAGNOSIS — Z23 Encounter for immunization: Secondary | ICD-10-CM | POA: Diagnosis not present

## 2024-11-08 DIAGNOSIS — S83272A Complex tear of lateral meniscus, current injury, left knee, initial encounter: Secondary | ICD-10-CM | POA: Diagnosis not present

## 2024-11-08 DIAGNOSIS — Z79899 Other long term (current) drug therapy: Secondary | ICD-10-CM | POA: Diagnosis not present

## 2024-11-08 DIAGNOSIS — L309 Dermatitis, unspecified: Secondary | ICD-10-CM | POA: Diagnosis not present

## 2024-11-24 DIAGNOSIS — G43E09 Chronic migraine with aura, not intractable, without status migrainosus: Secondary | ICD-10-CM | POA: Diagnosis not present

## 2024-11-29 ENCOUNTER — Telehealth: Payer: Self-pay | Admitting: *Deleted

## 2024-11-29 NOTE — Telephone Encounter (Signed)
 Narrative  MRI HEAD WO W CONTRAST:  MR evaluation of the brain was completed with sagittal, axial, and coronal sequences emphasizing T1, T2, FLAIR, diffusion, and postcontrast differences.  CONTRAST: Gadavist, 6.2 mL. ADVERSE REACTION: None.  COMPARISON: No comparison.  FINDINGS:  Ventricles and sulci appear normal in size and morphology. Negative for intracranial mass effect, midline shift, hydrocephalus, or hemorrhage. There is no evidence of acute infarction. No brain parenchymal signal abnormality detected.  No intracranial enhancement abnormality detected. Procedure Note  Jacky Fairy PARAS, MD - 11/24/2024 Formatting of this note might be different from the original. MRI HEAD WO W CONTRAST:  MR evaluation of the brain was completed with sagittal, axial, and coronal sequences emphasizing T1, T2, FLAIR, diffusion, and postcontrast differences.  CONTRAST: Gadavist, 6.2 mL. ADVERSE REACTION: None.  COMPARISON: No comparison.  FINDINGS:  Ventricles and sulci appear normal in size and morphology. Negative for intracranial mass effect, midline shift, hydrocephalus, or hemorrhage. There is no evidence of acute infarction. No brain parenchymal signal abnormality detected.  No intracranial enhancement abnormality detected.   IMPRESSION:  1. NORMAL STUDY.  Electronically Signed by: Fairy DOROTHA Jacky on 11/24/2024 2:26 PM
